# Patient Record
Sex: Female | Born: 1980
Health system: Southern US, Community
[De-identification: ages and names within clinical notes are randomized; demographics above are authoritative.]

## PROBLEM LIST (undated history)

## (undated) DIAGNOSIS — F419 Anxiety disorder, unspecified: Secondary | ICD-10-CM

## (undated) HISTORY — DX: Anxiety disorder, unspecified: F41.9

## (undated) HISTORY — PX: TUBAL LIGATION: SHX77

---
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
## 1998-04-20 ENCOUNTER — Other Ambulatory Visit: Admission: RE | Admit: 1998-04-20 | Discharge: 1998-04-20 | Payer: Self-pay | Admitting: Obstetrics & Gynecology

## 1998-08-13 ENCOUNTER — Other Ambulatory Visit: Admission: RE | Admit: 1998-08-13 | Discharge: 1998-08-13 | Payer: Self-pay | Admitting: Obstetrics & Gynecology

## 1999-06-13 ENCOUNTER — Other Ambulatory Visit: Admission: RE | Admit: 1999-06-13 | Discharge: 1999-06-13 | Payer: Self-pay | Admitting: Obstetrics & Gynecology

## 2000-08-27 ENCOUNTER — Other Ambulatory Visit: Admission: RE | Admit: 2000-08-27 | Discharge: 2000-08-27 | Payer: Self-pay | Admitting: Gynecology

## 2001-08-01 ENCOUNTER — Other Ambulatory Visit: Admission: RE | Admit: 2001-08-01 | Discharge: 2001-08-01 | Payer: Self-pay | Admitting: Gynecology

## 2002-10-28 ENCOUNTER — Other Ambulatory Visit: Admission: RE | Admit: 2002-10-28 | Discharge: 2002-10-28 | Payer: Self-pay | Admitting: Gynecology

## 2004-01-18 ENCOUNTER — Other Ambulatory Visit: Admission: RE | Admit: 2004-01-18 | Discharge: 2004-01-18 | Payer: Self-pay | Admitting: Gynecology

## 2006-02-13 ENCOUNTER — Other Ambulatory Visit: Admission: RE | Admit: 2006-02-13 | Discharge: 2006-02-13 | Payer: Self-pay | Admitting: Gynecology

## 2016-02-17 DIAGNOSIS — R946 Abnormal results of thyroid function studies: Secondary | ICD-10-CM | POA: Diagnosis not present

## 2016-02-17 DIAGNOSIS — Z01419 Encounter for gynecological examination (general) (routine) without abnormal findings: Secondary | ICD-10-CM | POA: Diagnosis not present

## 2016-02-17 DIAGNOSIS — Z1151 Encounter for screening for human papillomavirus (HPV): Secondary | ICD-10-CM | POA: Diagnosis not present

## 2016-02-17 DIAGNOSIS — Z6827 Body mass index (BMI) 27.0-27.9, adult: Secondary | ICD-10-CM | POA: Diagnosis not present

## 2016-03-17 ENCOUNTER — Ambulatory Visit (INDEPENDENT_AMBULATORY_CARE_PROVIDER_SITE_OTHER): Payer: BLUE CROSS/BLUE SHIELD | Admitting: Pediatrics

## 2016-03-17 ENCOUNTER — Encounter: Payer: Self-pay | Admitting: Pediatrics

## 2016-03-17 VITALS — BP 112/76 | HR 88 | Temp 99.2°F | Resp 18 | Ht 66.0 in | Wt 168.0 lb

## 2016-03-17 DIAGNOSIS — J101 Influenza due to other identified influenza virus with other respiratory manifestations: Secondary | ICD-10-CM | POA: Diagnosis not present

## 2016-03-17 DIAGNOSIS — R6889 Other general symptoms and signs: Secondary | ICD-10-CM | POA: Diagnosis not present

## 2016-03-17 LAB — VERITOR FLU A/B WAIVED
INFLUENZA B: POSITIVE — AB
Influenza A: NEGATIVE

## 2016-03-17 MED ORDER — OSELTAMIVIR PHOSPHATE 75 MG PO CAPS: 75.0000 mg | ORAL_CAPSULE | Freq: Two times a day (BID) | ORAL | 0 refills | Status: DC

## 2016-03-17 NOTE — Patient Instructions (Signed)
Netipot with distilled water 2-3 times a day to clear out sinuses Or Normal saline nasal spray Flonase steroid nasal spray Antihistamine daily such as cetirizine Ibuprofen 600mg three times a day Lots of fluids  

## 2016-03-17 NOTE — Progress Notes (Signed)
  Subjective:   Patient ID: Monica Gibson, female    DOB: 09/11/1980, 36 y.o.   MRN: [id number] CC: Cough; Nasal Congestion; Ear Pain; and Headache  HPI: Monica Gibson is a 36 y.o. female presenting for Cough; Nasal Congestion; Ear Pain; and Headache  Has had several illnesses this winter Started having some symptoms for 2 weeks Had a bad head cold starting then, ear pain, congestion Couldn't hear very well for several days Two days ago when she woke up felt a whole lot worse again Having sweats   Past Medical History:  Diagnosis Date  . Anxiety    No family history on file. Social History   Social History  . Marital status: Married    Spouse name: N/A  . Number of children: N/A  . Years of education: N/A   Social History Main Topics  . Smoking status: Current Every Day Smoker    Packs/day: 0.50    Types: Cigarettes  . Smokeless tobacco: Never Used  . Alcohol use No  . Drug use: No  . Sexual activity: Not on file   Other Topics Concern  . Not on file   Social History Narrative  . No narrative on file   ROS: All systems negative other than what is in HPI  Objective:    BP 112/76   Pulse 88   Temp 99.2 F (37.3 C) (Oral)   Resp 18   Ht 5\' 6"  (1.676 m)   Wt 168 lb (76.2 kg)   SpO2 98%   BMI 27.12 kg/m   Wt Readings from Last 3 Encounters:  03/17/16 168 lb (76.2 kg)    Gen: NAD, alert, cooperative with exam, NCAT EYES: EOMI, no conjunctival injection, or no icterus ENT:  TMs dull gray b/l, OP without erythema LYMPH: no cervical LAD CV: NRRR, normal S1/S2, no murmur, distal pulses 2+ b/l Resp: CTABL, no wheezes, normal WOB Abd: +BS, soft, NTND. no guarding or organomegaly Ext: No edema, warm Neuro: Alert and oriented  Assessment & Plan:  Monica Gibson was seen today for cough, nasal congestion, ear pain and headache.  Diagnoses and all orders for this visit:  Influenza B Start below Discussed symptomatic care Sent in ppx for husband, daughter. If calls back  with son's weight, can send in appropriate ppx for him in liquid. -     oseltamivir (TAMIFLU) 75 MG capsule; Take 1 capsule (75 mg total) by mouth 2 (two) times daily.  Flu-like symptoms -     Veritor Flu A/B Waived   Follow up plan: prn Rex Krasarol Vincent, MD [facility]

## 2016-03-22 ENCOUNTER — Ambulatory Visit (INDEPENDENT_AMBULATORY_CARE_PROVIDER_SITE_OTHER): Payer: BLUE CROSS/BLUE SHIELD | Admitting: Pediatrics

## 2016-03-22 ENCOUNTER — Encounter: Payer: Self-pay | Admitting: Pediatrics

## 2016-03-22 VITALS — BP 108/73 | HR 83 | Temp 97.3°F | Ht 66.0 in | Wt 163.6 lb

## 2016-03-22 DIAGNOSIS — J101 Influenza due to other identified influenza virus with other respiratory manifestations: Secondary | ICD-10-CM

## 2016-03-22 DIAGNOSIS — R05 Cough: Secondary | ICD-10-CM

## 2016-03-22 DIAGNOSIS — R059 Cough, unspecified: Secondary | ICD-10-CM

## 2016-03-22 MED ORDER — GUAIFENESIN-CODEINE 100-10 MG/5ML PO SOLN: 5.0000 mL | Freq: Three times a day (TID) | ORAL | 0 refills | Status: DC | PRN

## 2016-03-22 NOTE — Progress Notes (Signed)
  Subjective:   Patient ID: Monica Gibson, female    DOB: 23-Oct-1980, 36 y.o.   MRN: [id number] CC: Fever (Finished tamiflu yesterday); Fatigue; Cough; and Nasal Congestion  HPI: Monica Gibson is a 36 y.o. female presenting for Fever (Finished tamiflu yesterday); Fatigue; Cough; and Nasal Congestion  Had influenza B Yesterday felt slightly better Went back to work Feeling very tired, rundown last night Temp to 99.5 yesterday Coughing still going on, not getting any worse, not getting gbetter Talking gets out of breath nyquil helping some at night  Relevant past medical, surgical, family and social history reviewed. Allergies and medications reviewed and updated. History  Smoking Status  . Current Every Day Smoker  . Packs/day: 0.50  . Types: Cigarettes  Smokeless Tobacco  . Never Used   ROS: Per HPI   Objective:    BP 108/73   Pulse 83   Temp 97.3 F (36.3 C) (Oral)   Ht 5\' 6"  (1.676 m)   Wt 163 lb 9.6 oz (74.2 kg)   BMI 26.41 kg/m   Wt Readings from Last 3 Encounters:  03/22/16 163 lb 9.6 oz (74.2 kg)  03/17/16 168 lb (76.2 kg)    Gen: NAD, alert, cooperative with exam, NCAT, congested EYES: EOMI, no conjunctival injection, or no icterus ENT:  TMs dull gray b/l, OP without erythema LYMPH: no cervical LAD CV: NRRR, normal S1/S2, no murmur, distal pulses 2+ b/l Resp: CTABL, no wheezes, normal WOB Abd: +BS, soft, NTND. no guarding or organomegaly Ext: No edema, warm Neuro: Alert and oriented, strength equal b/l UE and LE, coordination grossly normal MSK: normal muscle bulk  Assessment & Plan:  Monica Gibson was seen today for fever, fatigue, cough and nasal congestion.  Diagnoses and all orders for this visit:  Cough Lung exam normal Likely related to flu Discussed symptomatic care Can try cough medicine as below. Keep out of reach of children, can cause sleepiness  Influenza B -     guaiFENesin-codeine 100-10 MG/5ML syrup; Take 5-10 mLs by mouth 3 (three) times daily  as needed for cough.   Follow up plan: prn Rex Krasarol Jakota Manthei, MD [facility]

## 2016-03-22 NOTE — Patient Instructions (Signed)
Netipot with distilled water 2-3 times a day to clear out sinuses Or Normal saline nasal spray Flonase steroid nasal spray Antihistamine daily such as cetirizine Ibuprofen 600mg three times a day Lots of fluids  

## 2016-12-13 ENCOUNTER — Encounter: Payer: Self-pay | Admitting: Pediatrics

## 2016-12-13 ENCOUNTER — Ambulatory Visit (INDEPENDENT_AMBULATORY_CARE_PROVIDER_SITE_OTHER): Payer: BLUE CROSS/BLUE SHIELD | Admitting: Pediatrics

## 2016-12-13 VITALS — BP 126/88 | HR 71 | Temp 98.1°F | Ht 66.0 in | Wt 165.2 lb

## 2016-12-13 DIAGNOSIS — N920 Excessive and frequent menstruation with regular cycle: Secondary | ICD-10-CM

## 2016-12-13 DIAGNOSIS — R42 Dizziness and giddiness: Secondary | ICD-10-CM

## 2016-12-13 LAB — CBC WITH DIFFERENTIAL/PLATELET
BASOS: 0 %
Basophils Absolute: 0 10*3/uL (ref 0.0–0.2)
EOS (ABSOLUTE): 0.1 10*3/uL (ref 0.0–0.4)
Eos: 1 %
HEMOGLOBIN: 14.9 g/dL (ref 11.1–15.9)
Hematocrit: 42.9 % (ref 34.0–46.6)
Immature Grans (Abs): 0 10*3/uL (ref 0.0–0.1)
Immature Granulocytes: 0 %
LYMPHS: 21 %
Lymphocytes Absolute: 2.4 10*3/uL (ref 0.7–3.1)
MCH: 30.5 pg (ref 26.6–33.0)
MCHC: 34.7 g/dL (ref 31.5–35.7)
MCV: 88 fL (ref 79–97)
MONOCYTES: 7 %
Monocytes Absolute: 0.9 10*3/uL (ref 0.1–0.9)
NEUTROS ABS: 8.4 10*3/uL — AB (ref 1.4–7.0)
Neutrophils: 71 %
Platelets: 260 10*3/uL (ref 150–379)
RBC: 4.89 x10E6/uL (ref 3.77–5.28)
RDW: 13.6 % (ref 12.3–15.4)
WBC: 11.9 10*3/uL — ABNORMAL HIGH (ref 3.4–10.8)

## 2016-12-13 LAB — PREGNANCY, URINE: Preg Test, Ur: NEGATIVE

## 2016-12-13 MED ORDER — MECLIZINE HCL 25 MG PO TABS: 25.0000 mg | ORAL_TABLET | Freq: Three times a day (TID) | ORAL | 0 refills | Status: DC | PRN

## 2016-12-13 NOTE — Progress Notes (Signed)
Subjective:   Patient ID: Monica Gibson, female    DOB: December 02, 1980, 36 y.o.   MRN: [id number] CC: Dizziness  HPI: Monica Gibson is a 36 y.o. female presenting for Dizziness  Started 4 days ago Had been up moving around at home in the morning, was folding clothes Suddenly felt like she couldn't get her balance, felt nauseous, had to lay down on the bed for about 5 min Then had to sit up really slowly before feeling back to normal Had some sore throat that morning Was out of power  Denies room spinning  Another episode last night, and today that were similar Last night lasted about 20 min, this morning about 10 min Last night's started when she was sitting down, isnt sure if she bent over, turned her head before feeling started Some nausea present during each episode Minimal cough now Sore throat is better Minimal runny nose now  Today still feels like it takes extra effort to look at something Back of head hurts some, feels like a nuisance, not bad Last night HA and dizziness seemed to happen the same time No HA first episode Doesn't feel like room is spinning so much as feels like boat is rocking  No EtOH Heavy periods since tube tie Had two periods this past month  Last eye exam May 2018 Takes prozac daily, no recent change Appetite is fine Feeling normal in between episdoes  Relevant past medical, surgical, family and social history reviewed. Allergies and medications reviewed and updated. History  Smoking Status  . Current Every Day Smoker  . Packs/day: 0.50  . Types: Cigarettes  Smokeless Tobacco  . Never Used   ROS: Per HPI   Objective:    BP 126/88   Pulse 71   Temp 98.1 F (36.7 C) (Oral)   Ht 5\' 6"  (1.676 m)   Wt 165 lb 3.2 oz (74.9 kg)   BMI 26.66 kg/m   Wt Readings from Last 3 Encounters:  12/13/16 165 lb 3.2 oz (74.9 kg)  03/22/16 163 lb 9.6 oz (74.2 kg)  03/17/16 168 lb (76.2 kg)    Gen: NAD, alert, cooperative with exam, NCAT EYES: EOMI, no  conjunctival injection, or no icterus, 1-2 beats of nystagmus with L-ward gaze sometimes, not every time ENT:  TMs pearly gray with scarring b/l with small clear effusion R side, OP with mild erythema LYMPH: no cervical LAD CV: NRRR, normal S1/S2, no murmur, distal pulses 2+ b/l Resp: CTABL, no wheezes, normal WOB Abd: +BS, soft, NTND. no guarding or organomegaly Ext: No edema, warm Neuro: Alert and oriented, strength equal b/l UE and LE, sensation intact all extremities, rapid alternating movements intact, cerebellar function intact, vision intact all 4 quadrants b/l, does seem more blurry R eye. Normal gait.  MSK: normal muscle bulk  Assessment & Plan:  Monica Gibson was seen today for dizziness.  Diagnoses and all orders for this visit:  Episodic lightheadedness Vertigo History difficult to differentiate between lightheadedness and vertigo Denies room spinning Orthostatics normal Will check CBC with menorrhagia Pt will get eyes examined with R sided blurriness Drink lots of fluids, trial meclizine, epley maneuver Return precautions discussed -     Pregnancy, urine -     CBC with Differential/Platelet  -     meclizine (ANTIVERT) 25 MG tablet; Take 1 tablet (25 mg total) by mouth 3 (three) times daily as needed for dizziness.  Menorrhagia with regular cycle  Follow up plan: Return in about 2 weeks (around 12/27/2016). Okey Regalarol  Evette Doffing, Violet

## 2016-12-13 NOTE — Patient Instructions (Addendum)

## 2016-12-26 ENCOUNTER — Encounter: Payer: Self-pay | Admitting: *Deleted

## 2017-01-24 DIAGNOSIS — M25512 Pain in left shoulder: Secondary | ICD-10-CM | POA: Diagnosis not present

## 2017-01-24 DIAGNOSIS — R079 Chest pain, unspecified: Secondary | ICD-10-CM | POA: Diagnosis not present

## 2017-01-24 DIAGNOSIS — F17200 Nicotine dependence, unspecified, uncomplicated: Secondary | ICD-10-CM | POA: Diagnosis not present

## 2017-01-24 DIAGNOSIS — S46812A Strain of other muscles, fascia and tendons at shoulder and upper arm level, left arm, initial encounter: Secondary | ICD-10-CM | POA: Diagnosis not present

## 2017-01-25 DIAGNOSIS — R079 Chest pain, unspecified: Secondary | ICD-10-CM | POA: Diagnosis not present

## 2017-01-28 DIAGNOSIS — Z6826 Body mass index (BMI) 26.0-26.9, adult: Secondary | ICD-10-CM | POA: Diagnosis not present

## 2017-01-28 DIAGNOSIS — S46912A Strain of unspecified muscle, fascia and tendon at shoulder and upper arm level, left arm, initial encounter: Secondary | ICD-10-CM | POA: Diagnosis not present

## 2017-01-28 DIAGNOSIS — M62838 Other muscle spasm: Secondary | ICD-10-CM | POA: Diagnosis not present

## 2017-01-28 DIAGNOSIS — M25512 Pain in left shoulder: Secondary | ICD-10-CM | POA: Diagnosis not present

## 2017-01-29 ENCOUNTER — Encounter: Payer: Self-pay | Admitting: Family Medicine

## 2017-01-29 ENCOUNTER — Ambulatory Visit: Payer: BLUE CROSS/BLUE SHIELD | Admitting: Family Medicine

## 2017-01-29 VITALS — BP 130/89 | HR 85 | Temp 97.4°F | Ht 66.0 in | Wt 162.0 lb

## 2017-01-29 DIAGNOSIS — S46812A Strain of other muscles, fascia and tendons at shoulder and upper arm level, left arm, initial encounter: Secondary | ICD-10-CM | POA: Diagnosis not present

## 2017-01-29 DIAGNOSIS — S161XXA Strain of muscle, fascia and tendon at neck level, initial encounter: Secondary | ICD-10-CM | POA: Diagnosis not present

## 2017-01-29 NOTE — Progress Notes (Signed)
BP 130/89   Pulse 85   Temp (!) 97.4 F (36.3 C) (Oral)   Ht 5\' 6"  (1.676 m)   Wt 162 lb (73.5 kg)   BMI 26.15 kg/m    Subjective:    Patient ID: Monica StakesLucy Poplar, female    DOB: 08-Dec-1980, 36 y.o.   MRN: [id number]  HPI: Monica Gibson is a 36 y.o. female presenting on 01/29/2017 for Left shoulder pain, numbness and tingling in fingers   HPI Left shoulder pain and back pain Patient comes in complaining of left shoulder pain that is been going on for about 8 days.  She says she was moving some pool equipment to get it went to rise and then the next day she woke up hurting through that side of her neck and into her upper back and she felt numbness going down into her left hand.  4 days ago she went to the emergency department because this pain was getting progressively worse and she was given an anti-inflammatory and a muscle relaxer which did not seem to help as much.  Yesterday she went to an urgent care and she was given prednisone and a stronger muscle relaxer and some oral prednisone to take home and told to follow-up with us.  She says the numbness has almost resolved and she is still having some stiffness but she was able to sleep last night and feeling overall a lot better.  She denies any numbness or weakness in the arm or hand today.  She mainly complains of the stiffness.  She says the pain was excruciating but now it is only moderate.  She says the pain does not radiate anywhere else.  Relevant past medical, surgical, family and social history reviewed and updated as indicated. Interim medical history since our last visit reviewed. Allergies and medications reviewed and updated.  Review of Systems  Constitutional: Negative for chills and fever.  Eyes: Negative for visual disturbance.  Respiratory: Negative for chest tightness and shortness of breath.   Cardiovascular: Negative for chest pain and leg swelling.  Musculoskeletal: Positive for arthralgias and myalgias. Negative for back  pain and gait problem.  Skin: Negative for rash.  Neurological: Negative for light-headedness and headaches.  Psychiatric/Behavioral: Negative for agitation and behavioral problems.  All other systems reviewed and are negative.   Per HPI unless specifically indicated above     Objective:    BP 130/89   Pulse 85   Temp (!) 97.4 F (36.3 C) (Oral)   Ht 5\' 6"  (1.676 m)   Wt 162 lb (73.5 kg)   BMI 26.15 kg/m   Wt Readings from Last 3 Encounters:  01/29/17 162 lb (73.5 kg)  12/13/16 165 lb 3.2 oz (74.9 kg)  03/22/16 163 lb 9.6 oz (74.2 kg)    Physical Exam  Constitutional: She is oriented to person, place, and time. She appears well-developed and well-nourished. No distress.  Eyes: Conjunctivae are normal.  Neck: Neck supple. No thyromegaly present.  Cardiovascular: Normal rate, regular rhythm, normal heart sounds and intact distal pulses.  No murmur heard. Pulmonary/Chest: Effort normal and breath sounds normal. No respiratory distress. She has no wheezes. She has no rales.  Musculoskeletal: Normal range of motion. She exhibits no edema.       Cervical back: She exhibits tenderness. She exhibits normal range of motion and no deformity.       Back:  Lymphadenopathy:    She has no cervical adenopathy.  Neurological: She is alert and oriented to  person, place, and time. Coordination normal.  Skin: Skin is warm and dry. No rash noted. She is not diaphoretic.  Psychiatric: She has a normal mood and affect. Her behavior is normal.  Nursing note and vitals reviewed.      Assessment & Plan:   Problem List Items Addressed This Visit    None    Visit Diagnoses    Infraspinatus strain, left, initial encounter    -  Primary   Neck strain, initial encounter       Given prednisone and muscle relaxer yesterday and feeling better today, will continue oral prednisone that she has, call back if PT is needed       Follow up plan: Return if symptoms worsen or fail to  improve.  Counseling provided for all of the vaccine components No orders of the defined types were placed in this encounter.   Arville CareJoshua Dettinger, MD [facility] 01/29/2017, 12:50 PM

## 2017-02-07 ENCOUNTER — Telehealth: Payer: Self-pay | Admitting: Pediatrics

## 2017-02-07 NOTE — Telephone Encounter (Signed)
Made an apt to see you tomorrow Has been taking aleve and it is not helping. Still having left side shoulder pain and the left hand numbness has been going on since she went to the Urgent care. Apt tomorrow at 3.

## 2017-02-07 NOTE — Telephone Encounter (Signed)
[facility] 12/2 and then came in to see Dettinger 12/3. Patient was [facility] 20[facility]  [facility] a left [facility] strain. States that it helped her a lot and is requesting a refill. Patient also states that she would like to see a specialist due to her still having numbness in her left hand.   If approved send refill into [facility] in [place].

## 2017-02-07 NOTE — Telephone Encounter (Signed)
Prednisone is not a long term treatment plan due to all of the side effects it can cause, can't refill it. She can take OTC naproxen/aleve, 2 tabs twice a day with food to help with the inflammation. Is pain still going on in her shoulder? Does she want to see physical therapy? That's what it looks like she discussed with Dr Dettinger per his note. Is the numbness new?

## 2017-02-08 ENCOUNTER — Ambulatory Visit (INDEPENDENT_AMBULATORY_CARE_PROVIDER_SITE_OTHER): Payer: BLUE CROSS/BLUE SHIELD

## 2017-02-08 ENCOUNTER — Ambulatory Visit: Payer: BLUE CROSS/BLUE SHIELD | Admitting: Pediatrics

## 2017-02-08 ENCOUNTER — Encounter: Payer: Self-pay | Admitting: Pediatrics

## 2017-02-08 VITALS — BP 130/86 | HR 84 | Temp 97.8°F | Ht 66.0 in | Wt 164.0 lb

## 2017-02-08 DIAGNOSIS — M62838 Other muscle spasm: Secondary | ICD-10-CM

## 2017-02-08 DIAGNOSIS — M4802 Spinal stenosis, cervical region: Secondary | ICD-10-CM | POA: Diagnosis not present

## 2017-02-08 MED ORDER — CYCLOBENZAPRINE HCL 10 MG PO TABS: 5.0000 mg | ORAL_TABLET | Freq: Two times a day (BID) | ORAL | 1 refills | Status: DC | PRN

## 2017-02-08 NOTE — Progress Notes (Signed)
  Subjective:   Patient ID: Monica Gibson, female    DOB: 05-May-1980, 36 y.o.   MRN: [id number] CC: Left arm pain  HPI: Monica StakesLucy Dombeck is a 36 y.o. female presenting for Left arm pain Started having pain medial to L shoulder blade 3 weeks ago Started after lifting up heavy pool at home Arm hurt some afterward, a couple days later after laying down on L shoulder on heating pad for an hour she got up with a lot more pain in L side of neck, overnight continued to get more pain in neck and going down L arm Now she points to the left side of her neck and down her left arm as to where the pain is bothering her most  L hand 2nd 3rd fingers numb all the time Gets some relief with raising L arm over head, now L hand gets tired easily such as with washing her hair  Patient is right-handed  Using L arm seems to make pain worse, standing is better than sitting Has good days and bad days over the past 2 weeks Taking ibuprofen regularly, helps some Muscle relaxer did not help from the emergency room  Relevant past medical, surgical, family and social history reviewed. Allergies and medications reviewed and updated. Social History   Tobacco Use  Smoking Status Current Every Day Smoker  . Packs/day: 0.50  . Types: Cigarettes  Smokeless Tobacco Never Used   ROS: Per HPI   Objective:    BP 130/86   Pulse 84   Temp 97.8 F (36.6 C) (Oral)   Ht 5\' 6"  (1.676 m)   Wt 164 lb (74.4 kg)   BMI 26.47 kg/m   Wt Readings from Last 3 Encounters:  02/08/17 164 lb (74.4 kg)  01/29/17 162 lb (73.5 kg)  12/13/16 165 lb 3.2 oz (74.9 kg)    Gen: NAD, alert, cooperative with exam, NCAT EYES: EOMI, no conjunctival injection, or no icterus CV: NRRR, normal S1/S2 Resp: CTABL, no wheezes, normal WOB Abd: +BS, soft, NTND. Ext: No edema, warm Neuro: Alert and oriented, coordination grossly normal, decreased sensation to touch 2nd and 3rd fingers L hand MSK: muscle spasm L posterior neck, ttp, palpation causes  shooting pain down her arm Full range of motion at left shoulder Rotator cuff muscles intact Hand grip 5 out of 5 bilaterally 5 out of 5 strength bilateral flexion extension at the elbows  decreased ROM neck with turning head to R, tilting head to R  Assessment & Plan:  36yo female with arm pain  Neck muscle spasm Symptoms, exam and xray consistent with muscle spasm Discussed heat, rest, gentle stretching, NSAIDs, Rx for flexeril given, referral to PT -     Ambulatory referral to Physical Therapy -     Ambulatory referral to Orthopedic Surgery -     DG Cervical Spine Complete; Future -     cyclobenzaprine (FLEXERIL) 10 MG tablet; Take 0.5-1 tablets (5-10 mg total) by mouth 2 (two) times daily as needed for muscle spasms.   Follow up plan: Return if symptoms worsen or fail to improve. Rex Krasarol Kathren Scearce, MD [facility]

## 2017-02-13 ENCOUNTER — Encounter (INDEPENDENT_AMBULATORY_CARE_PROVIDER_SITE_OTHER): Payer: Self-pay | Admitting: Orthopaedic Surgery

## 2017-02-13 ENCOUNTER — Ambulatory Visit (INDEPENDENT_AMBULATORY_CARE_PROVIDER_SITE_OTHER): Payer: BLUE CROSS/BLUE SHIELD | Admitting: Orthopaedic Surgery

## 2017-02-13 ENCOUNTER — Telehealth (INDEPENDENT_AMBULATORY_CARE_PROVIDER_SITE_OTHER): Payer: Self-pay | Admitting: Orthopaedic Surgery

## 2017-02-13 DIAGNOSIS — M5412 Radiculopathy, cervical region: Secondary | ICD-10-CM

## 2017-02-13 DIAGNOSIS — M542 Cervicalgia: Secondary | ICD-10-CM

## 2017-02-13 MED ORDER — PREDNISONE 10 MG (21) PO TBPK: ORAL_TABLET | ORAL | 0 refills | Status: DC

## 2017-02-13 MED ORDER — TRAMADOL HCL 50 MG PO TABS: 50.0000 mg | ORAL_TABLET | Freq: Three times a day (TID) | ORAL | 2 refills | Status: AC | PRN

## 2017-02-13 MED ORDER — TIZANIDINE HCL 4 MG PO TABS: 4.0000 mg | ORAL_TABLET | Freq: Four times a day (QID) | ORAL | 2 refills | Status: AC | PRN

## 2017-02-13 MED ORDER — GABAPENTIN 100 MG PO CAPS: 100.0000 mg | ORAL_CAPSULE | Freq: Three times a day (TID) | ORAL | 3 refills | Status: AC

## 2017-02-13 NOTE — Addendum Note (Signed)
Addended by: Albertina ParrGARCIA, Costantino Kohlbeck on: 02/13/2017 09:14 AM   Modules accepted: Orders

## 2017-02-13 NOTE — Telephone Encounter (Signed)
Patient would like order for [place] closer [name]. Possibly Monica Gibson if possible. Can you see if you can refer her somewhere else? CB # [phone]

## 2017-02-13 NOTE — Progress Notes (Signed)
Office Visit Note   Patient: Monica Gibson           Date of Birth: 1980-08-15           MRN: [id number] Visit Date: 02/13/2017              Requested by: Johna SheriffVincent, Carol L, MD [address] [place], [place] [address] PCP: Johna SheriffVincent, Carol L, MD   Assessment & Plan: Visit Diagnoses:  1. Cervical radiculopathy     Plan: Impression is cervical radiculopathy.  Patient has tried multiple muscle relaxers and prednisone taper with minimal relief.  Recommend MRI of the cervical spine to rule out structural abnormalities.  Prescription for Zanaflex, prednisone taper, Neurontin, tramadol.  Questions encouraged and answered.  Follow-up to review the MRI. Total face to face encounter time was greater than 45 minutes and over half of this time was spent in counseling and/or coordination of care.  Follow-Up Instructions: Return in about 2 weeks (around 02/27/2017).   Orders:  No orders of the defined types were placed in this encounter.  Meds ordered this encounter  Medications  . tiZANidine (ZANAFLEX) 4 MG tablet    Sig: Take 1 tablet (4 mg total) by mouth every 6 (six) hours as needed for muscle spasms.    Dispense:  30 tablet    Refill:  2  . predniSONE (STERAPRED UNI-PAK 21 TAB) 10 MG (21) TBPK tablet    Sig: Take as directed    Dispense:  21 tablet    Refill:  0  . gabapentin (NEURONTIN) 100 MG capsule    Sig: Take 1 capsule (100 mg total) by mouth 3 (three) times daily.    Dispense:  30 capsule    Refill:  3  . traMADol (ULTRAM) 50 MG tablet    Sig: Take 1-2 tablets (50-100 mg total) by mouth 3 (three) times daily as needed.    Dispense:  30 tablet    Refill:  2      Procedures: No procedures performed   Clinical Data: No additional findings.   Subjective: Chief Complaint  Patient presents with  . Neck - Pain    Patient is a 36 year old female comes in with 3-week history of neck and left upper extremity radicular pain.  Denies any injuries.  She did state she helped her  husband put up a swimming pool around the time of the onset of symptoms.  Denies any injuries.  She does endorse some numbness and tingling down the back of her left arm.  Denies any focal weakness or signs or symptoms of myelopathy.  Denies any bowel or bladder dysfunction.  Denies any constitutional symptoms.    Review of Systems  Constitutional: Negative.   HENT: Negative.   Eyes: Negative.   Respiratory: Negative.   Cardiovascular: Negative.   Endocrine: Negative.   Musculoskeletal: Negative.   Neurological: Negative.   Hematological: Negative.   Psychiatric/Behavioral: Negative.   All other systems reviewed and are negative.    Objective: Vital Signs: There were no vitals taken for this visit.  Physical Exam  Constitutional: She is oriented to person, place, and time. She appears well-developed and well-nourished.  HENT:  Head: Normocephalic and atraumatic.  Eyes: EOM are normal.  Neck: Neck supple.  Pulmonary/Chest: Effort normal.  Abdominal: Soft.  Neurological: She is alert and oriented to person, place, and time.  Skin: Skin is warm. Capillary refill takes less than 2 seconds.  Psychiatric: She has a normal mood and affect. Her behavior  is normal. Judgment and thought content normal.  Nursing note and vitals reviewed.   Ortho Exam Physical exam shows a negative Spurling sign.  Normal reflexes.  Negative  Homans sign.  Mild decreased sensation in the left thumb. Specialty Comments:  No specialty comments available.  Imaging: No results found.   PMFS History: There are no active problems to display for this patient.  Past Medical History:  Diagnosis Date  . Anxiety     History reviewed. No pertinent family history.  Past Surgical History:  Procedure Laterality Date  . TUBAL LIGATION     Social History   Occupational History  . Not on file  Tobacco Use  . Smoking status: Current Every Day Smoker    Packs/day: 0.50    Types: Cigarettes  .  Smokeless tobacco: Never Used  Substance and Sexual Activity  . Alcohol use: No  . Drug use: No  . Sexual activity: Not on file

## 2017-02-13 NOTE — Telephone Encounter (Signed)
See message. Specified in order. FYI

## 2017-02-14 NOTE — Telephone Encounter (Signed)
[name], [facility]

## 2017-02-23 ENCOUNTER — Ambulatory Visit (HOSPITAL_COMMUNITY)
Admission: RE | Admit: 2017-02-23 | Discharge: 2017-02-23 | Disposition: A | Discharge status: Home / Self Care | Payer: BLUE CROSS/BLUE SHIELD | Source: Ambulatory Visit | Attending: Orthopaedic Surgery | Admitting: Orthopaedic Surgery

## 2017-02-23 DIAGNOSIS — M50223 Other cervical disc displacement at C6-C7 level: Secondary | ICD-10-CM | POA: Insufficient documentation

## 2017-02-23 DIAGNOSIS — M4802 Spinal stenosis, cervical region: Secondary | ICD-10-CM | POA: Insufficient documentation

## 2017-02-23 DIAGNOSIS — M542 Cervicalgia: Secondary | ICD-10-CM | POA: Diagnosis not present

## 2017-03-01 ENCOUNTER — Ambulatory Visit (INDEPENDENT_AMBULATORY_CARE_PROVIDER_SITE_OTHER): Payer: BLUE CROSS/BLUE SHIELD | Admitting: Orthopaedic Surgery

## 2017-03-01 ENCOUNTER — Encounter (INDEPENDENT_AMBULATORY_CARE_PROVIDER_SITE_OTHER): Payer: Self-pay | Admitting: Orthopaedic Surgery

## 2017-03-01 DIAGNOSIS — M5412 Radiculopathy, cervical region: Secondary | ICD-10-CM

## 2017-03-01 NOTE — Addendum Note (Signed)
Addended by: Albertina ParrGARCIA, Jaqulyn Chancellor on: 03/01/2017 09:02 AM   Modules accepted: Orders

## 2017-03-01 NOTE — Progress Notes (Signed)
   Office Visit Note   Patient: Monica Gibson           Date of Birth: 05/09/1980           MRN: [id number] Visit Date: 03/01/2017              Requested by: Johna SheriffVincent, Carol L, MD [address] [place], [place] [address] PCP: Johna SheriffVincent, Carol L, MD   Assessment & Plan: Visit Diagnoses:  1. Cervical radiculopathy     Plan: MRI findings are consistent with a large left-sided C6-7 disc herniation with severe foraminal stenosis.  Referral to Dr. Conchita ParisNundkumar for discussion of possible surgery.  Follow-up with me as needed.  Follow-Up Instructions: Return if symptoms worsen or fail to improve.   Orders:  No orders of the defined types were placed in this encounter.  No orders of the defined types were placed in this encounter.     Procedures: No procedures performed   Clinical Data: No additional findings.   Subjective: Chief Complaint  Patient presents with  . Neck - Pain    Patient follows up today to review her MRI.  She is overall feeling a little bit better.  She does still has numbness in the left C7 distribution and weakness with elbow extension.    Review of Systems   Objective: Vital Signs: There were no vitals taken for this visit.  Physical Exam  Ortho Exam Exam shows weakness of elbow extension and numbness in the middle finger. Specialty Comments:  No specialty comments available.  Imaging: No results found.   PMFS History: There are no active problems to display for this patient.  Past Medical History:  Diagnosis Date  . Anxiety     History reviewed. No pertinent family history.  Past Surgical History:  Procedure Laterality Date  . TUBAL LIGATION     Social History   Occupational History  . Not on file  Tobacco Use  . Smoking status: Current Every Day Smoker    Packs/day: 0.50    Types: Cigarettes  . Smokeless tobacco: Never Used  Substance and Sexual Activity  . Alcohol use: No  . Drug use: No  . Sexual activity: Not on file

## 2017-03-14 DIAGNOSIS — M5020 Other cervical disc displacement, unspecified cervical region: Secondary | ICD-10-CM | POA: Diagnosis not present

## 2017-03-30 DIAGNOSIS — M50123 Cervical disc disorder at C6-C7 level with radiculopathy: Secondary | ICD-10-CM | POA: Diagnosis not present

## 2017-03-30 DIAGNOSIS — M5020 Other cervical disc displacement, unspecified cervical region: Secondary | ICD-10-CM | POA: Diagnosis not present

## 2017-04-18 DIAGNOSIS — M5020 Other cervical disc displacement, unspecified cervical region: Secondary | ICD-10-CM | POA: Diagnosis not present

## 2017-07-09 DIAGNOSIS — Z113 Encounter for screening for infections with a predominantly sexual mode of transmission: Secondary | ICD-10-CM | POA: Diagnosis not present

## 2017-07-09 DIAGNOSIS — Z6826 Body mass index (BMI) 26.0-26.9, adult: Secondary | ICD-10-CM | POA: Diagnosis not present

## 2017-07-09 DIAGNOSIS — Z114 Encounter for screening for human immunodeficiency virus [HIV]: Secondary | ICD-10-CM | POA: Diagnosis not present

## 2017-07-09 DIAGNOSIS — N920 Excessive and frequent menstruation with regular cycle: Secondary | ICD-10-CM | POA: Diagnosis not present

## 2017-07-09 DIAGNOSIS — Z01411 Encounter for gynecological examination (general) (routine) with abnormal findings: Secondary | ICD-10-CM | POA: Diagnosis not present

## 2017-08-06 DIAGNOSIS — R87612 Low grade squamous intraepithelial lesion on cytologic smear of cervix (LGSIL): Secondary | ICD-10-CM | POA: Diagnosis not present

## 2017-08-06 DIAGNOSIS — N72 Inflammatory disease of cervix uteri: Secondary | ICD-10-CM | POA: Diagnosis not present

## 2017-08-06 DIAGNOSIS — N870 Mild cervical dysplasia: Secondary | ICD-10-CM | POA: Diagnosis not present

## 2017-08-07 DIAGNOSIS — F419 Anxiety disorder, unspecified: Secondary | ICD-10-CM | POA: Insufficient documentation

## 2017-08-07 DIAGNOSIS — N920 Excessive and frequent menstruation with regular cycle: Secondary | ICD-10-CM | POA: Insufficient documentation

## 2017-08-07 DIAGNOSIS — Z6825 Body mass index (BMI) 25.0-25.9, adult: Secondary | ICD-10-CM | POA: Diagnosis not present

## 2018-08-03 IMAGING — DX DG CERVICAL SPINE COMPLETE 4+V
7 series · 7 of 7 positions shown · non-contrast
Comparison: None.

CLINICAL DATA: Cervicalgia

EXAM:
CERVICAL SPINE - COMPLETE 4+ VIEW

[c-spine lat (1 of 2)]
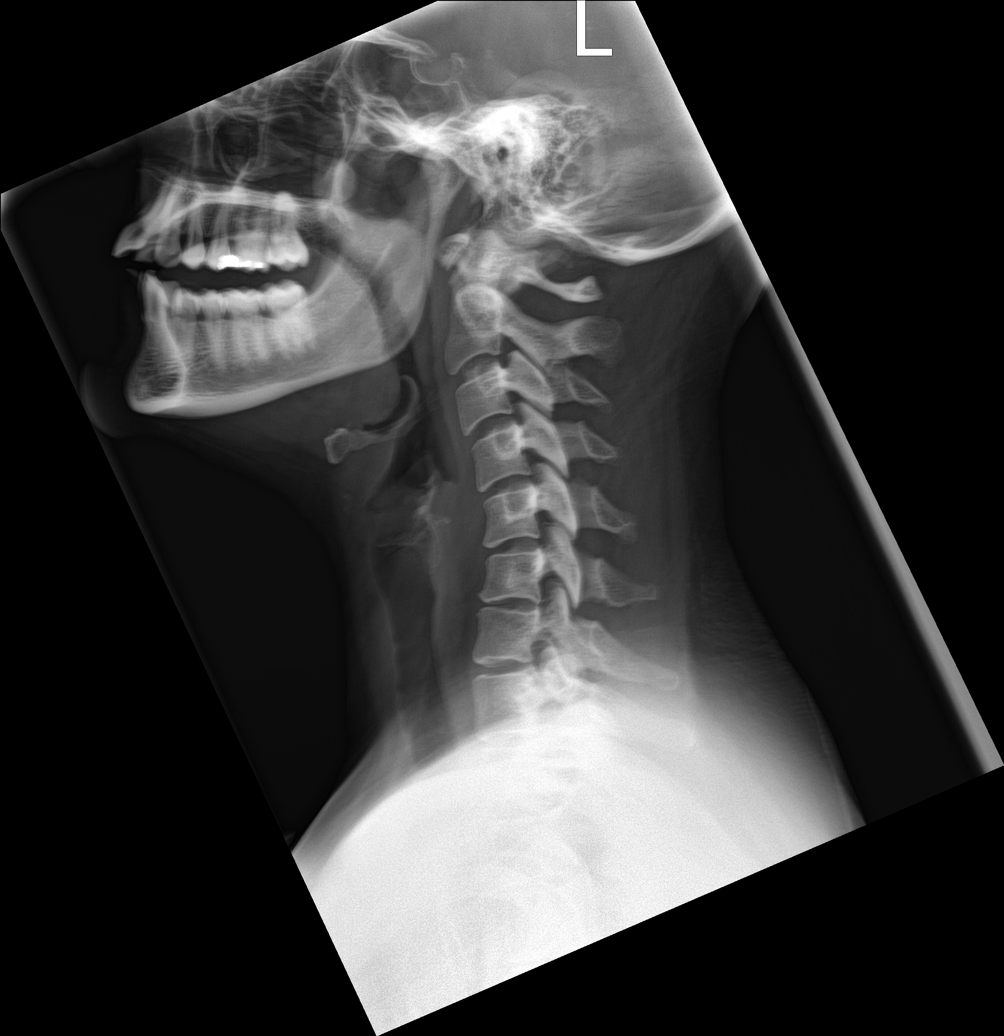

[c-spine obl (1 of 3)]
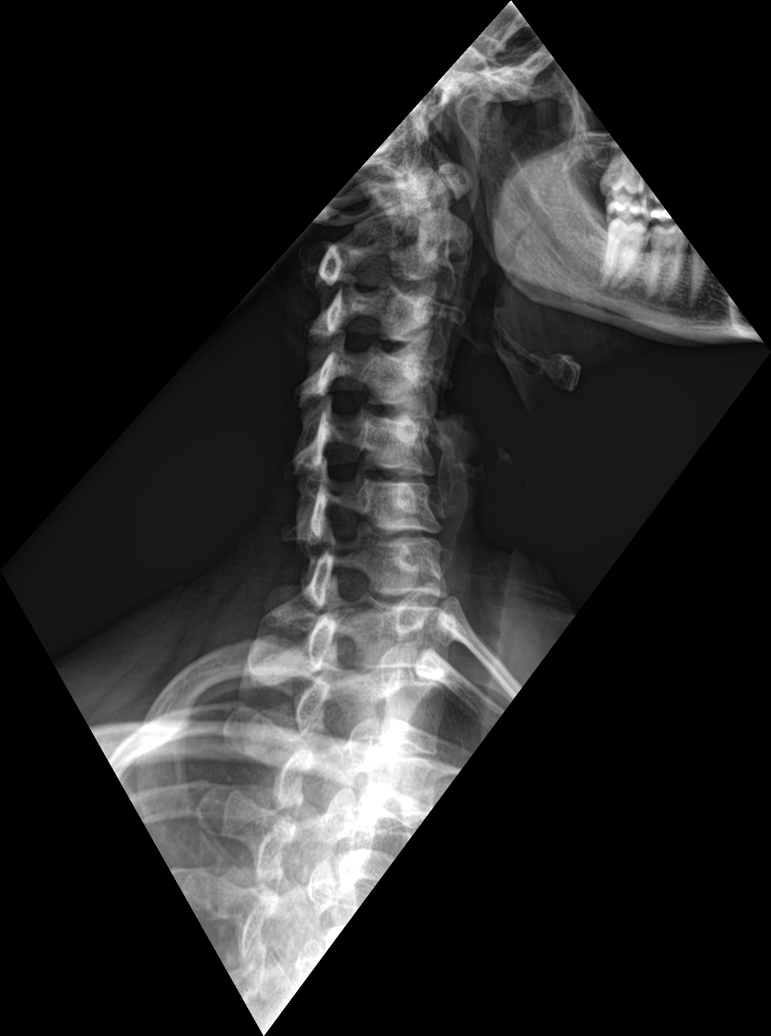

[c-spine obl (2 of 3)]
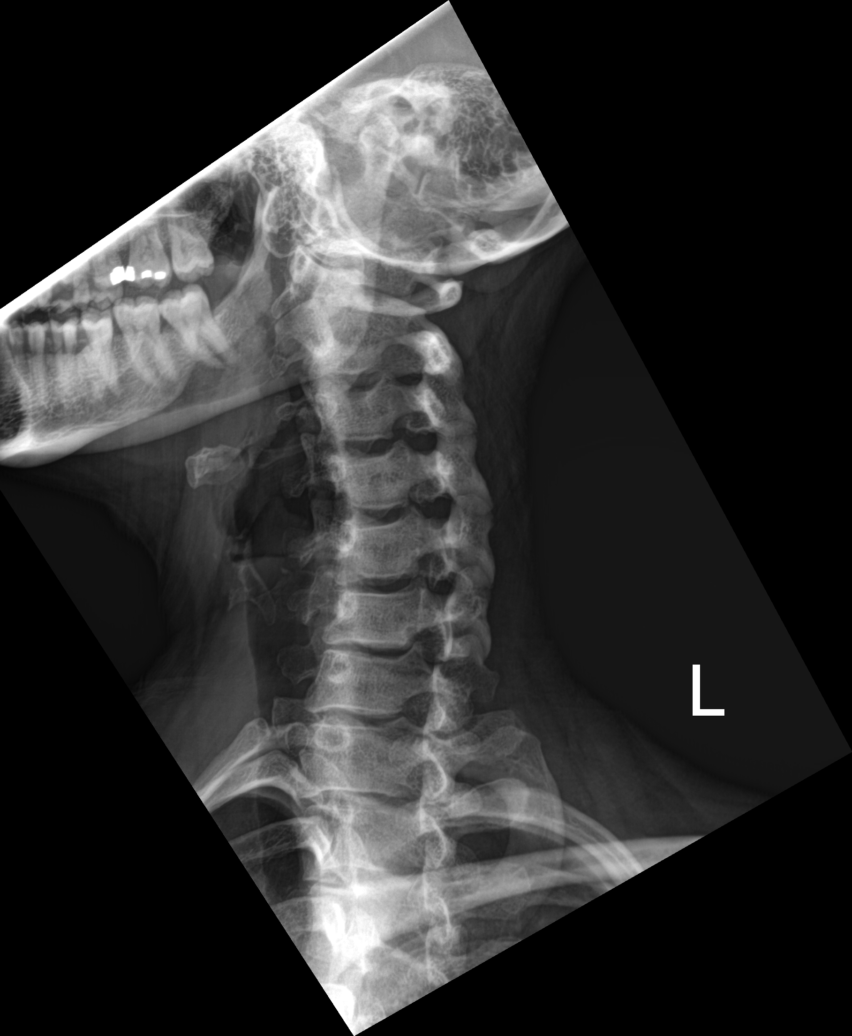

[c-spine ap]
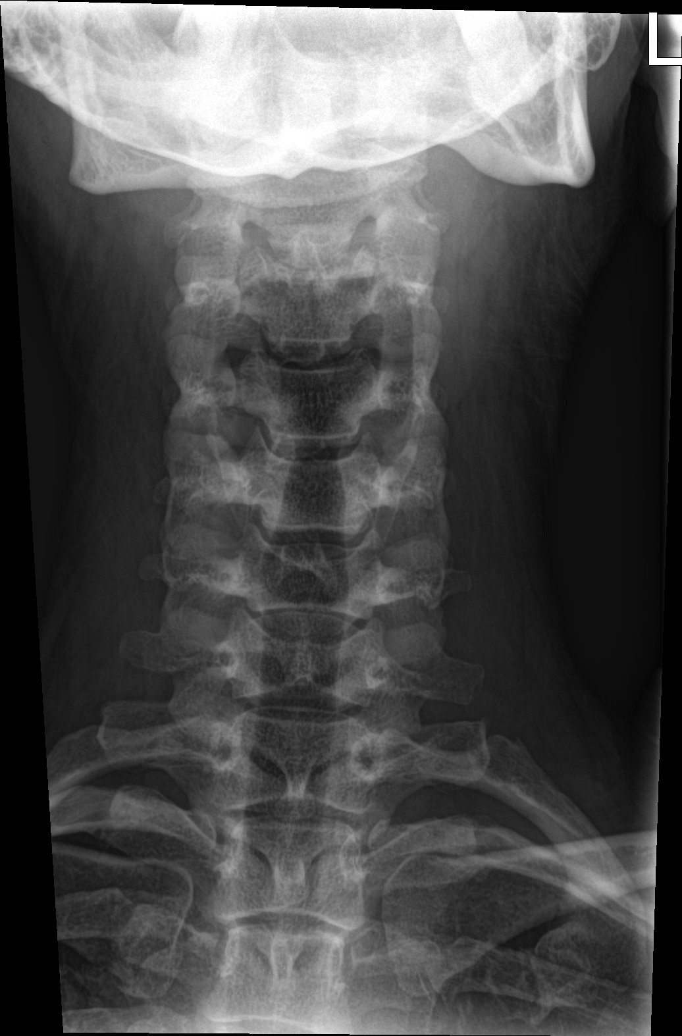

[c-spine open mouth]
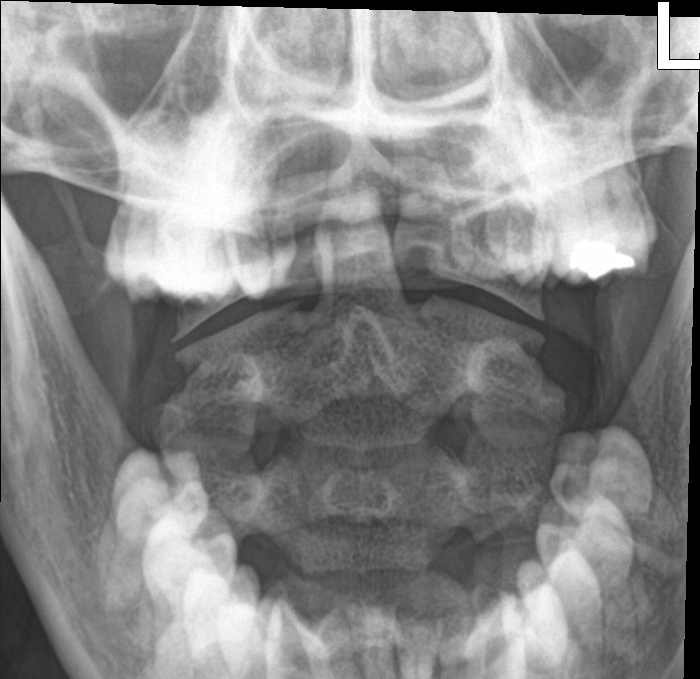

[c-spine lat (2 of 2)]
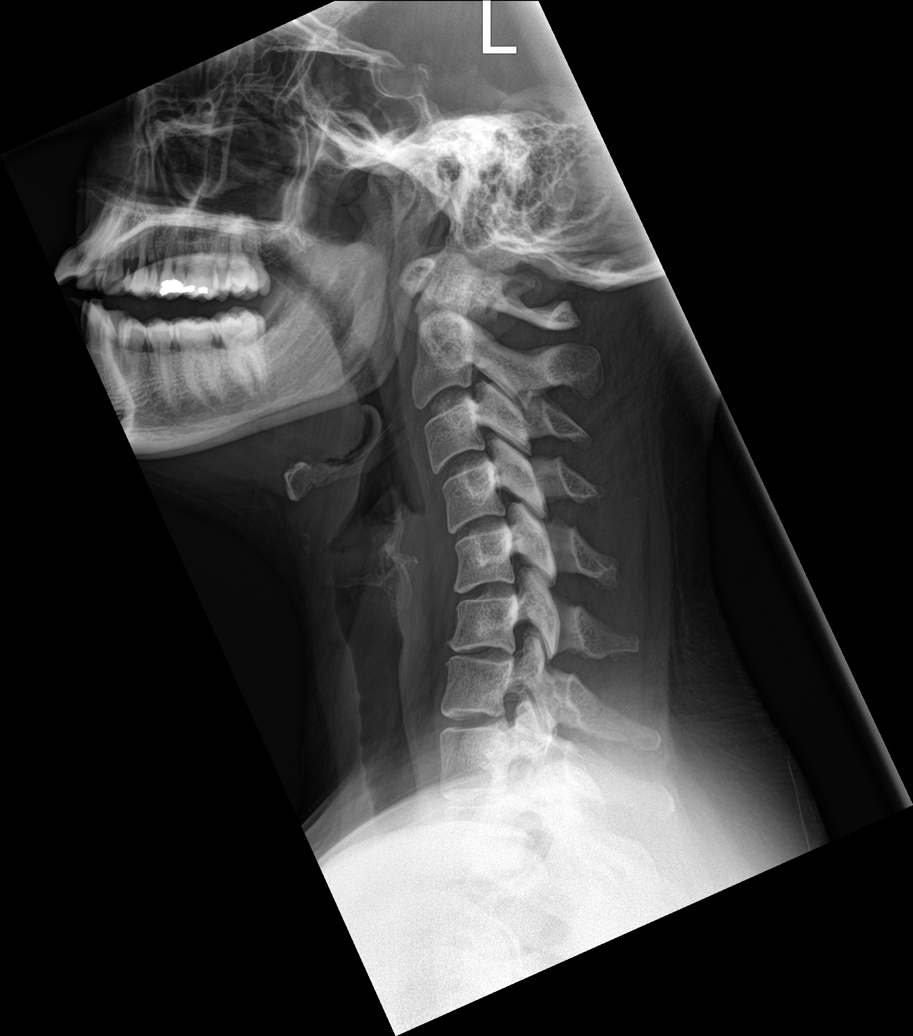

[c-spine obl (3 of 3)]
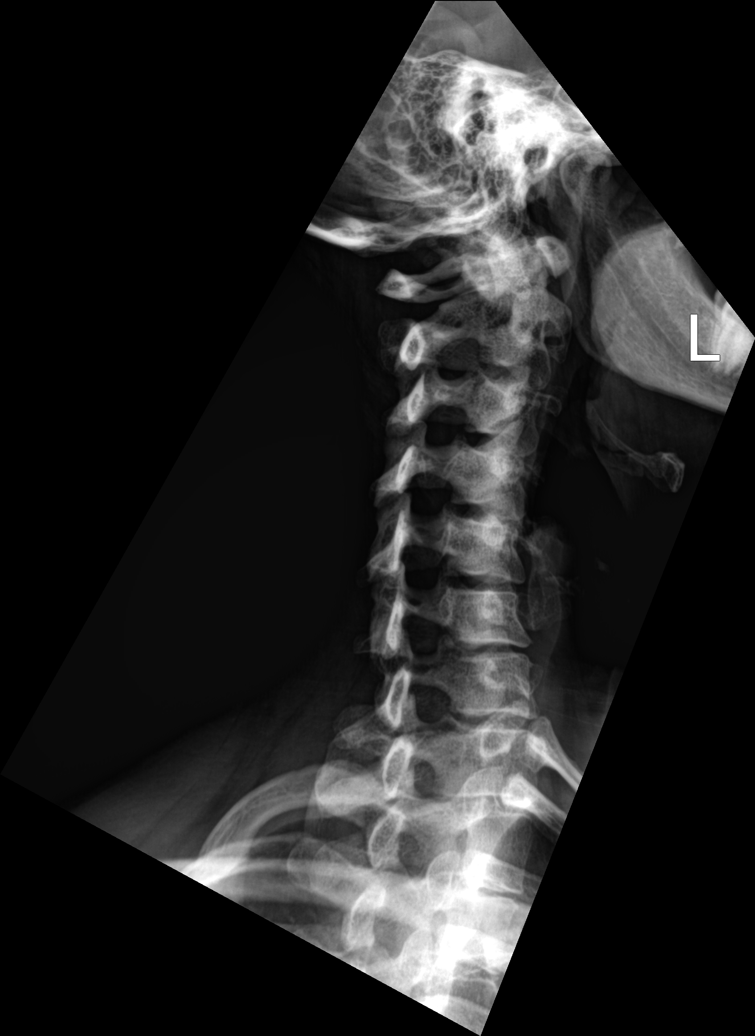

[7 of 7 positions shown; findings below may reference images not displayed]

FINDINGS: Frontal, lateral, open-mouth odontoid, and bilateral oblique views
were obtained. There is no fracture or spondylolisthesis.
Prevertebral soft tissues and predental space regions are normal.
There is moderate disc space narrowing at C6-7. Other disc spaces
appear unremarkable. There is no appreciable exit foraminal
narrowing on the oblique views. There is reversal of lordotic
curvature. Lung apices are clear.
IMPRESSION: There is reversal of lordotic curvature, a finding most likely
indicative of muscle spasm. There is focal disc space narrowing at
C6-7. Other disc spaces appear unremarkable. No fracture or
spondylolisthesis.

## 2018-10-14 ENCOUNTER — Other Ambulatory Visit: Payer: Self-pay

## 2018-10-14 ENCOUNTER — Ambulatory Visit (INDEPENDENT_AMBULATORY_CARE_PROVIDER_SITE_OTHER): Payer: BC Managed Care – PPO | Admitting: Family Medicine

## 2018-10-14 ENCOUNTER — Encounter: Payer: Self-pay | Admitting: Family Medicine

## 2018-10-14 DIAGNOSIS — R6889 Other general symptoms and signs: Secondary | ICD-10-CM

## 2018-10-14 DIAGNOSIS — Z20822 Contact with and (suspected) exposure to covid-19: Secondary | ICD-10-CM

## 2018-10-14 DIAGNOSIS — J069 Acute upper respiratory infection, unspecified: Secondary | ICD-10-CM

## 2018-10-14 MED ORDER — PSEUDOEPH-BROMPHEN-DM 30-2-10 MG/5ML PO SYRP: 5.0000 mL | ORAL_SOLUTION | Freq: Four times a day (QID) | ORAL | 0 refills | Status: DC | PRN

## 2018-10-14 NOTE — Patient Instructions (Signed)
Testing locations for COVID-19  Testing sites are open Monday - Friday from 8:00 am to 3:30 pm.  [place]: [facility], [address], [place], [place]  [place]: [facility], [address], [place], [place] (entrance off of [address])  [place]: [facility], [address], [place], [place] (across from [facility] entrance) 

## 2018-10-14 NOTE — Progress Notes (Signed)
Virtual Visit via telephone Note Due to COVID-19 pandemic this visit was conducted virtually. This visit type was conducted due to national recommendations for restrictions regarding the COVID-19 Pandemic (e.g. social distancing, sheltering in place) in an effort to limit this patient's exposure and mitigate transmission in our community. All issues noted in this document were discussed and addressed.  A physical exam was not performed with this format.   I connected with Monica Gibson on 10/14/18 at 1020 by telephone and verified that I am speaking with the correct person using two identifiers. Monica Gibson is currently located at home and family is currently with them during visit. The provider, Monia Pouch, FNP is located in their office at time of visit.  I discussed the limitations, risks, security and privacy concerns of performing an evaluation and management service by telephone and the availability of in person appointments. I also discussed with the patient that there may be a patient responsible charge related to this service. The patient expressed understanding and agreed to proceed.  Subjective:  Patient ID: Monica Gibson, female    DOB: 29-Oct-1980, 38 y.o.   MRN: [id number]  Chief Complaint:  Cough and Nasal Congestion   HPI: Monica Gibson is a 38 y.o. female presenting on 10/14/2018 for Cough and Nasal Congestion   Pt reports cough, congestion, scratchy throat, malaise, and nausea. Pt states she was feeling bad on 10/07/2018. States she had an upset stomach with nausea. States she then developed malaise, congestion, cough, rhinorrhea, postnasal drainage, and a scratchy throat. States she has been working from home and has not been exposed to anyone sick that she can recall.  Cough This is a new problem. The current episode started in the past 7 days. The problem has been waxing and waning. The problem occurs every few minutes. The cough is non-productive. Associated symptoms include  chills, a fever, headaches, myalgias, nasal congestion, postnasal drip, rhinorrhea, a sore throat and sweats. Pertinent negatives include no chest pain, ear congestion, ear pain, heartburn, hemoptysis, rash, shortness of breath, weight loss or wheezing. She has tried nothing for the symptoms.     Relevant past medical, surgical, family, and social history reviewed and updated as indicated.  Allergies and medications reviewed and updated.   Past Medical History:  Diagnosis Date  . Anxiety     Past Surgical History:  Procedure Laterality Date  . TUBAL LIGATION      Social History   Socioeconomic History  . Marital status: Married    Spouse name: Not on file  . Number of children: Not on file  . Years of education: Not on file  . Highest education level: Not on file  Occupational History  . Not on file  Social Needs  . Financial resource strain: Not on file  . Food insecurity    Worry: Not on file    Inability: Not on file  . Transportation needs    Medical: Not on file    Non-medical: Not on file  Tobacco Use  . Smoking status: Current Every Day Smoker    Packs/day: 0.50    Types: Cigarettes  . Smokeless tobacco: Never Used  Substance and Sexual Activity  . Alcohol use: No  . Drug use: No  . Sexual activity: Not on file  Lifestyle  . Physical activity    Days per week: Not on file    Minutes per session: Not on file  . Stress: Not on file  Relationships  . Social connections  Talks on phone: Not on file    Gets together: Not on file    Attends religious service: Not on file    Active member of club or organization: Not on file    Attends meetings of clubs or organizations: Not on file    Relationship status: Not on file  . Intimate partner violence    Fear of current or ex partner: Not on file    Emotionally abused: Not on file    Physically abused: Not on file    Forced sexual activity: Not on file  Other Topics Concern  . Not on file  Social History  Narrative  . Not on file    Outpatient Encounter Medications as of 10/14/2018  Medication Sig  . brompheniramine-pseudoephedrine-DM 30-2-10 MG/5ML syrup Take 5 mLs by mouth 4 (four) times daily as needed.  . cyclobenzaprine (FLEXERIL) 10 MG tablet Take 0.5-1 tablets (5-10 mg total) by mouth 2 (two) times daily as needed for muscle spasms.  . diclofenac (VOLTAREN) 75 MG EC tablet Take by mouth.  Marland Kitchen. FLUoxetine (PROZAC) 40 MG capsule   . gabapentin (NEURONTIN) 100 MG capsule Take 1 capsule (100 mg total) by mouth 3 (three) times daily.  . predniSONE (STERAPRED UNI-PAK 21 TAB) 10 MG (21) TBPK tablet Take as directed  . tiZANidine (ZANAFLEX) 4 MG tablet Take 1 tablet (4 mg total) by mouth every 6 (six) hours as needed for muscle spasms.  . traMADol (ULTRAM) 50 MG tablet Take 1-2 tablets (50-100 mg total) by mouth 3 (three) times daily as needed.   No facility-administered encounter medications on file as of 10/14/2018.     No Known Allergies  Review of Systems  Constitutional: Positive for chills, fatigue and fever. Negative for activity change, appetite change, diaphoresis, unexpected weight change and weight loss.  HENT: Positive for congestion, postnasal drip, rhinorrhea and sore throat. Negative for dental problem, drooling, ear discharge, ear pain, facial swelling, hearing loss, mouth sores, nosebleeds, sinus pressure, sinus pain, sneezing, tinnitus, trouble swallowing and voice change.   Eyes: Negative for photophobia and visual disturbance.  Respiratory: Positive for cough. Negative for hemoptysis, chest tightness, shortness of breath and wheezing.   Cardiovascular: Negative for chest pain, palpitations and leg swelling.  Gastrointestinal: Positive for abdominal pain and nausea. Negative for constipation, diarrhea, heartburn and vomiting.  Genitourinary: Negative for decreased urine volume and difficulty urinating.  Musculoskeletal: Positive for myalgias.  Skin: Negative for rash.   Neurological: Positive for headaches. Negative for dizziness, tremors, seizures, syncope, facial asymmetry, speech difficulty, weakness, light-headedness and numbness.  Psychiatric/Behavioral: Negative for confusion.  All other systems reviewed and are negative.        Observations/Objective: No vital signs or physical exam, this was a telephone or virtual health encounter.  Pt alert and oriented, answers all questions appropriately, and able to speak in full sentences.    Assessment and Plan: Monica Gibson was seen today for cough and nasal congestion.  Diagnoses and all orders for this visit:  URI with cough and congestion Suspected Covid-19 Virus Infection -     Novel Coronavirus, NAA ([facility]), Future Reported symptoms consistent with viral URI. No known sick contacts or recent travel but concerning for possible COVID-19, will order testing. Pt aware of self quarantine measures until negative test results or symptom free without medications for 72 hours. Symptomatic care discussed. Increase fluid intake. Bromfed as needed. Tylenol for fever, pain control. Report any new or worsening symptoms. Follow up as needed.  -  brompheniramine-pseudoephedrine-DM 30-2-10 MG/5ML syrup; Take 5 mLs by mouth 4 (four) times daily as needed.   Follow Up Instructions: Return if symptoms worsen or fail to improve.    I discussed the assessment and treatment plan with the patient. The patient was provided an opportunity to ask questions and all were answered. The patient agreed with the plan and demonstrated an understanding of the instructions.   The patient was advised to call back or seek an in-person evaluation if the symptoms worsen or if the condition fails to improve as anticipated.  The above assessment and management plan was discussed with the patient. The patient verbalized understanding of and has agreed to the management plan. Patient is aware to call the clinic if symptoms persist or worsen.  Patient is aware when to return to the clinic for a follow-up visit. Patient educated on when it is appropriate to go to the emergency department.    I provided 15 minutes of non-face-to-face time during this encounter. The call started at 1020. The call ended at 1035. The other time was used for coordination of care.    Monica BaarsMichelle Klynn Linnemann, FNP-C [facility] [address] [place], [place] [address] [phone] 10/14/18

## 2018-10-15 LAB — NOVEL CORONAVIRUS, NAA: SARS-CoV-2, NAA: NOT DETECTED

## 2018-10-16 ENCOUNTER — Telehealth: Payer: Self-pay | Admitting: Family Medicine

## 2018-10-16 NOTE — Telephone Encounter (Signed)
Pt aware covid lab test negative, not detected °

## 2019-01-28 DIAGNOSIS — Z20828 Contact with and (suspected) exposure to other viral communicable diseases: Secondary | ICD-10-CM | POA: Diagnosis not present

## 2019-07-31 ENCOUNTER — Other Ambulatory Visit (HOSPITAL_COMMUNITY): Payer: Self-pay | Admitting: Physician Assistant

## 2019-07-31 ENCOUNTER — Other Ambulatory Visit: Payer: Self-pay | Admitting: Physician Assistant

## 2019-07-31 DIAGNOSIS — M542 Cervicalgia: Secondary | ICD-10-CM

## 2019-08-11 ENCOUNTER — Ambulatory Visit (HOSPITAL_COMMUNITY)
Admission: RE | Admit: 2019-08-11 | Discharge: 2019-08-11 | Disposition: A | Discharge status: Home / Self Care | Payer: BC Managed Care – PPO | Source: Ambulatory Visit | Attending: Physician Assistant | Admitting: Physician Assistant

## 2019-08-11 ENCOUNTER — Other Ambulatory Visit: Payer: Self-pay

## 2019-08-11 DIAGNOSIS — M542 Cervicalgia: Secondary | ICD-10-CM | POA: Insufficient documentation

## 2020-03-24 ENCOUNTER — Ambulatory Visit (INDEPENDENT_AMBULATORY_CARE_PROVIDER_SITE_OTHER): Payer: No Typology Code available for payment source | Admitting: Family Medicine

## 2020-03-24 ENCOUNTER — Encounter: Payer: Self-pay | Admitting: Family Medicine

## 2020-03-24 DIAGNOSIS — J069 Acute upper respiratory infection, unspecified: Secondary | ICD-10-CM

## 2020-03-24 MED ORDER — AZITHROMYCIN 250 MG PO TABS: ORAL_TABLET | ORAL | 0 refills | Status: AC

## 2020-03-24 MED ORDER — DEXAMETHASONE 6 MG PO TABS: 6.0000 mg | ORAL_TABLET | Freq: Every day | ORAL | 0 refills | Status: AC

## 2020-03-24 NOTE — Progress Notes (Signed)
Virtual Visit via Telephone Note  I connected with Monica Gibson on 03/24/20 at 9:49 AM by telephone and verified that I am speaking with the correct person using two identifiers. Monica Gibson is currently located at home and nobody is currently with her during this visit. The provider, Gwenlyn Fudge, FNP is located in their office at time of visit.  I discussed the limitations, risks, security and privacy concerns of performing an evaluation and management service by telephone and the availability of in person appointments. I also discussed with the patient that there may be a patient responsible charge related to this service. The patient expressed understanding and agreed to proceed.  Subjective: PCP: Sonny Masters, FNP (Inactive)  Chief Complaint  Patient presents with  . URI   Patient complains of headache (improving), fever (initially), vomiting (resolved), body aches (improving) and chills. Additional symptoms include cough, head congestion, sore throat (resolved), ear pain/pressure and facial pain/pressure. Onset of symptoms was 2 days ago, gradually improving since that time. She is drinking plenty of fluids. Evaluation to date: Patient reports she was tested for COVID-19 yesterday but has not yet got the results. Treatment to date: Theraflu yesterday. She does smoke. Patient has not been vaccinated against influenza or COVID-19.    ROS: Per HPI  Current Outpatient Medications:  .  diclofenac (VOLTAREN) 75 MG EC tablet, Take by mouth., Disp: , Rfl:  .  FLUoxetine (PROZAC) 40 MG capsule, , Disp: , Rfl: 1 .  gabapentin (NEURONTIN) 100 MG capsule, Take 1 capsule (100 mg total) by mouth 3 (three) times daily., Disp: 30 capsule, Rfl: 3 .  tiZANidine (ZANAFLEX) 4 MG tablet, Take 1 tablet (4 mg total) by mouth every 6 (six) hours as needed for muscle spasms., Disp: 30 tablet, Rfl: 2 .  traMADol (ULTRAM) 50 MG tablet, Take 1-2 tablets (50-100 mg total) by mouth 3 (three) times daily as  needed., Disp: 30 tablet, Rfl: 2  No Known Allergies Past Medical History:  Diagnosis Date  . Anxiety     Observations/Objective: A&O  No respiratory distress or wheezing audible over the phone Mood, judgement, and thought processes all WNL  Assessment and Plan: 1. Upper respiratory tract infection, unspecified type - Discussed symptom management. Antibiotic prescribed due to patient's tobacco use and that I suspect this is COVID-19. Patient declined testing for influenza. - dexamethasone (DECADRON) 6 MG tablet; Take 1 tablet (6 mg total) by mouth daily for 5 days.  Dispense: 5 tablet; Refill: 0 - azithromycin (ZITHROMAX Z-PAK) 250 MG tablet; Take 2 tablets (500 mg) PO today, then 1 tablet (250 mg) PO daily x4 days.  Dispense: 6 tablet; Refill: 0   Follow Up Instructions:  I discussed the assessment and treatment plan with the patient. The patient was provided an opportunity to ask questions and all were answered. The patient agreed with the plan and demonstrated an understanding of the instructions.   The patient was advised to call back or seek an in-person evaluation if the symptoms worsen or if the condition fails to improve as anticipated.  The above assessment and management plan was discussed with the patient. The patient verbalized understanding of and has agreed to the management plan. Patient is aware to call the clinic if symptoms persist or worsen. Patient is aware when to return to the clinic for a follow-up visit. Patient educated on when it is appropriate to go to the emergency department.   Time call ended: 10:01 AM  I provided 14 minutes  of non-face-to-face time during this encounter.  Deliah Boston, MSN, APRN, FNP-C [facility] 03/24/20

## 2023-01-15 ENCOUNTER — Ambulatory Visit
Admission: EM | Admit: 2023-01-15 | Discharge: 2023-01-15 | Disposition: A | Discharge status: Home / Self Care | Payer: BC Managed Care – PPO
